# Patient Record
Sex: Male | Born: 1995 | Race: Asian | Hispanic: No | Marital: Single
Health system: Southern US, Community
[De-identification: ages and names within clinical notes are randomized; demographics above are authoritative.]

---
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
## 2015-05-22 ENCOUNTER — Other Ambulatory Visit (HOSPITAL_COMMUNITY): Payer: Self-pay | Admitting: Orthopaedic Surgery

## 2015-05-22 DIAGNOSIS — M25572 Pain in left ankle and joints of left foot: Principal | ICD-10-CM

## 2015-05-22 DIAGNOSIS — M25571 Pain in right ankle and joints of right foot: Secondary | ICD-10-CM

## 2015-06-02 ENCOUNTER — Ambulatory Visit (HOSPITAL_COMMUNITY)
Admission: RE | Admit: 2015-06-02 | Discharge: 2015-06-02 | Disposition: A | Discharge status: Home / Self Care | Payer: BLUE CROSS/BLUE SHIELD | Source: Ambulatory Visit | Attending: Orthopaedic Surgery | Admitting: Orthopaedic Surgery

## 2015-06-02 DIAGNOSIS — M25571 Pain in right ankle and joints of right foot: Secondary | ICD-10-CM | POA: Diagnosis not present

## 2015-06-02 DIAGNOSIS — M25572 Pain in left ankle and joints of left foot: Secondary | ICD-10-CM | POA: Diagnosis present

## 2015-06-02 DIAGNOSIS — Q6689 Other  specified congenital deformities of feet: Secondary | ICD-10-CM | POA: Diagnosis not present

## 2016-12-31 IMAGING — MR MR ANKLE*L* W/O CM
4 of 5 series · 19 of 40 positions shown · non-contrast
Comparison: Radiographs 11/29/2013

CLINICAL DATA: Constant pain in both ankles. Symptoms for over 1
year.

EXAM:
MRI OF THE LEFT ANKLE WITHOUT CONTRAST
TECHNIQUE: Multiplanar, multisequence MR imaging of the ankle was performed. No
intravenous contrast was administered.

[Series 2: PD fat-sat · axial · 4.0mm · 0.29mm/px · z∈[-69,+86]mm · 9 of 32 slices shown]
[im 1/32]
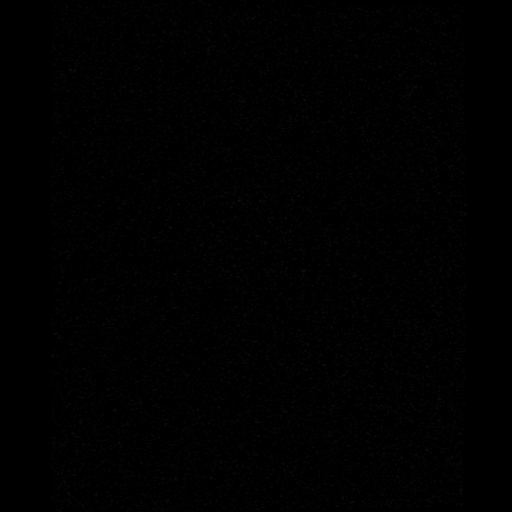
[im 4/32]
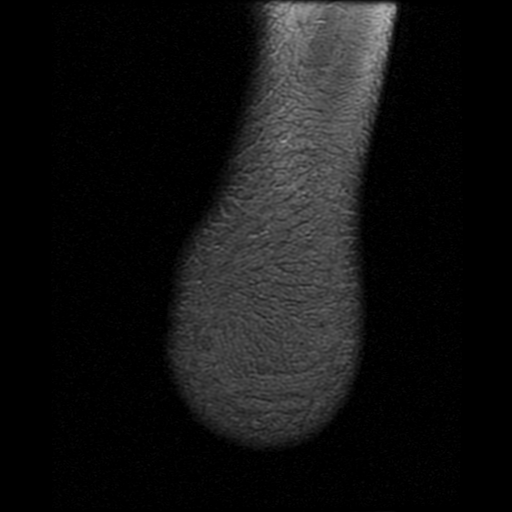
[im 8/32]
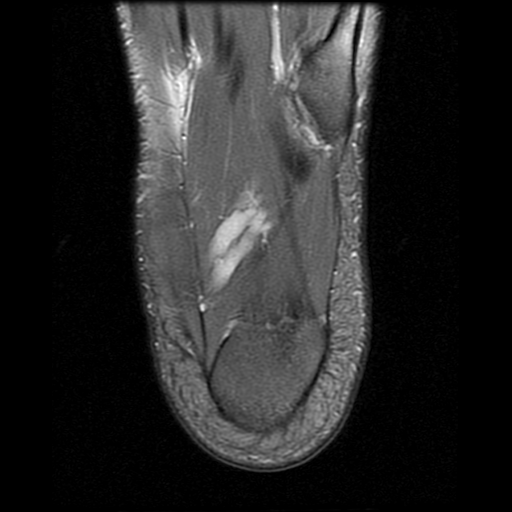
[im 12/32]
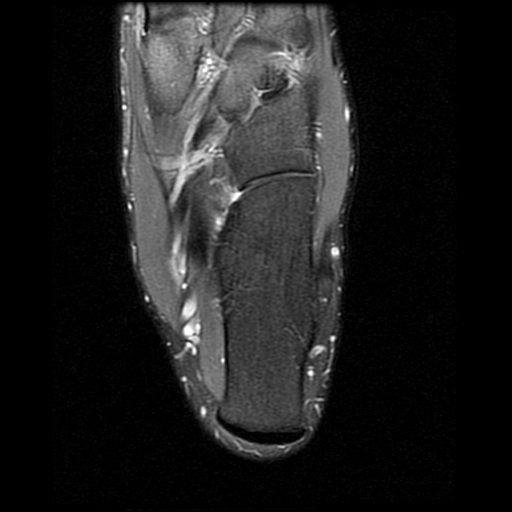
[im 16/32]
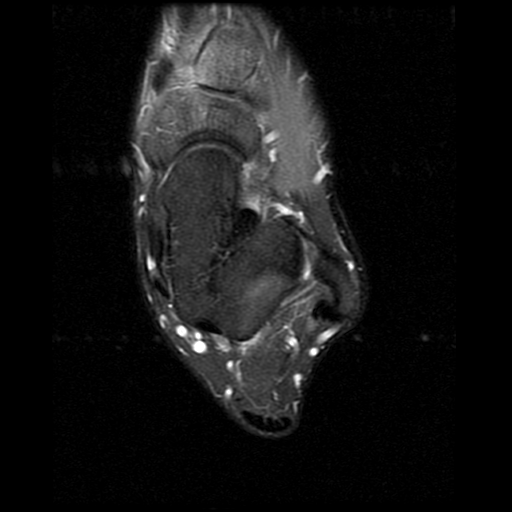
[im 20/32]
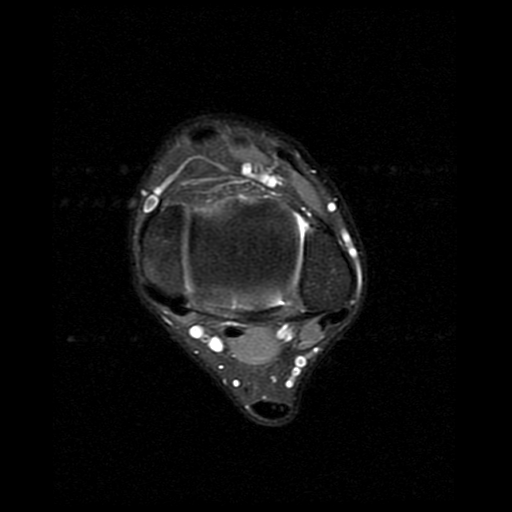
[im 24/32]
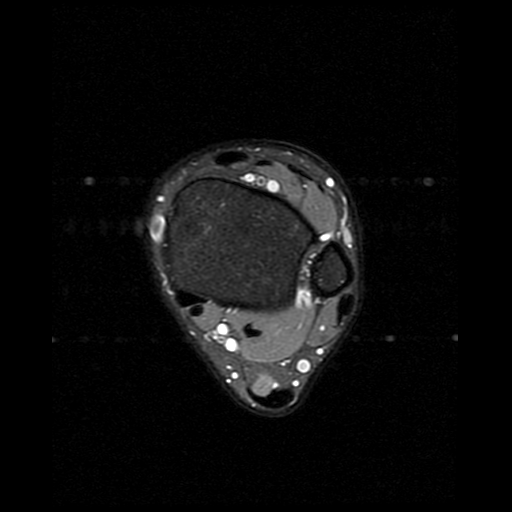
[im 28/32]
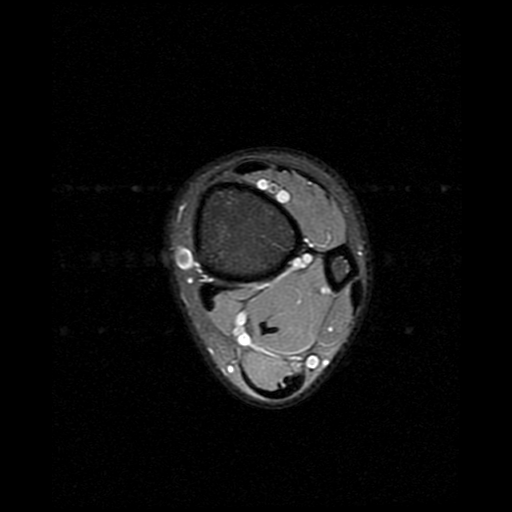
[im 32/32]
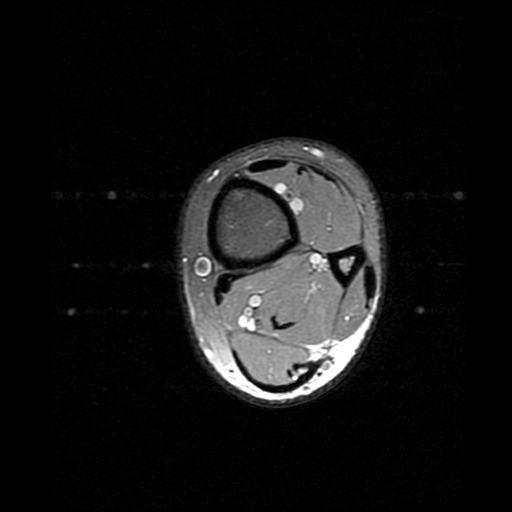

[Series 3: T2 fat-sat · axial · 4.0mm · 0.29mm/px · z∈[-69,+66]mm · 4 of 32 slices shown (1 of 3)]
[im 1/32]
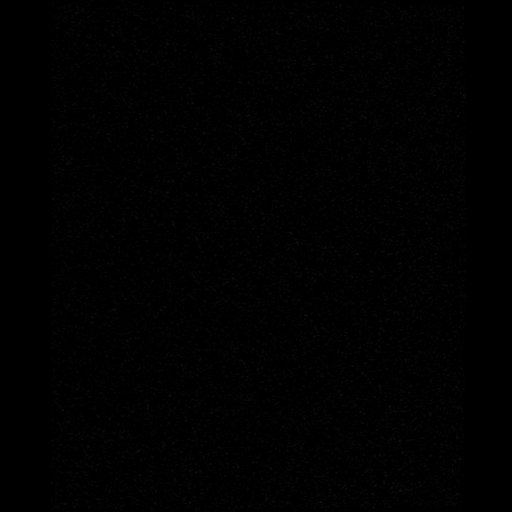
[im 4/32]
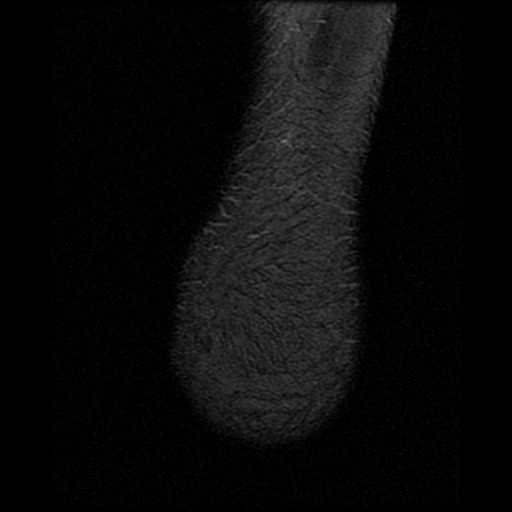
[im 18/32]
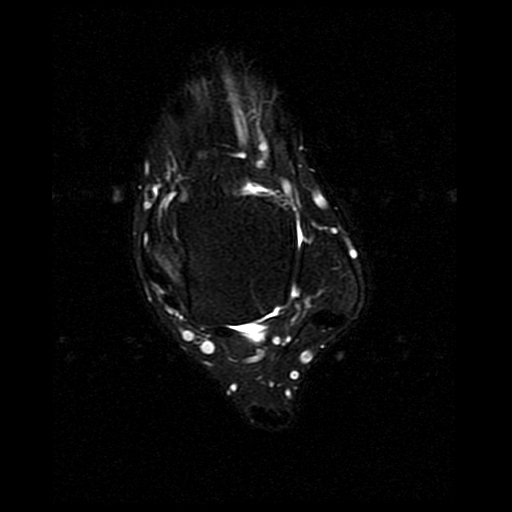
[im 28/32]
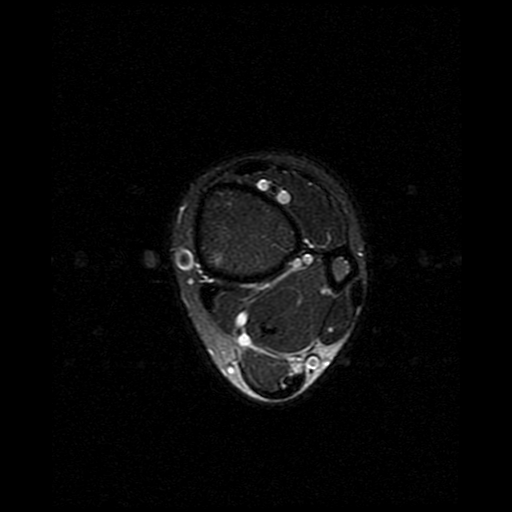

[Series 4: T2 fat-sat · sagittal · 4.0mm · 0.29mm/px · 3 of 19 slices shown (2 of 3)]
[im 4/19]
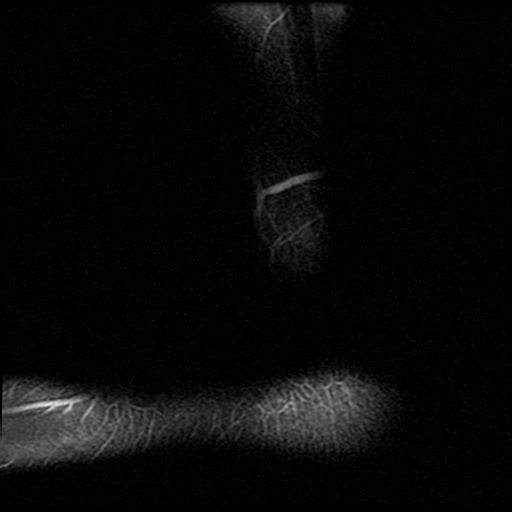
[im 11/19]
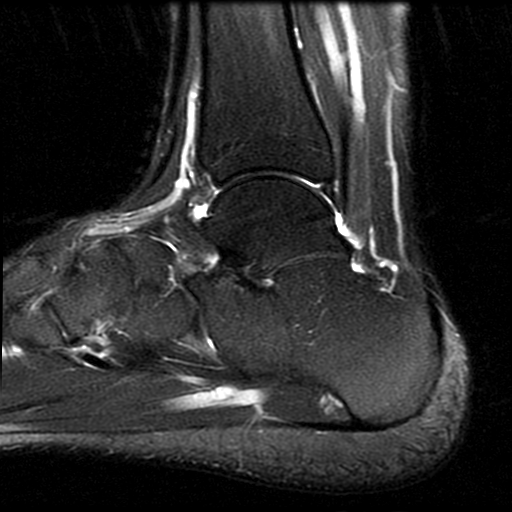
[im 19/19]
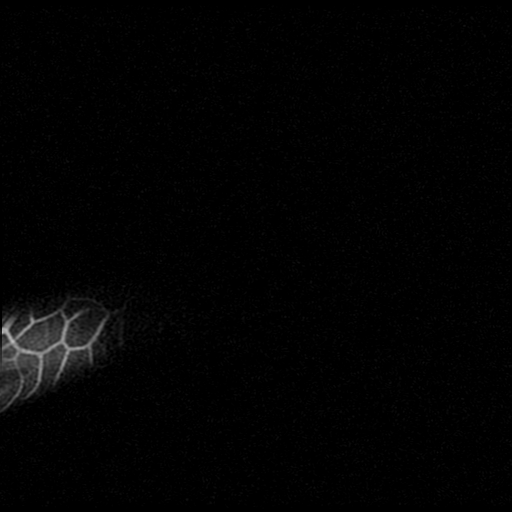

[Series 6: T2 fat-sat · coronal · 4.0mm · 0.29mm/px · 3 of 29 slices shown (3 of 3)]
[im 4/29]
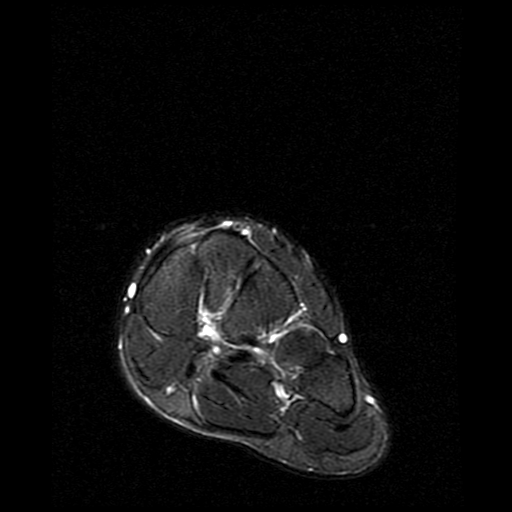
[im 15/29]
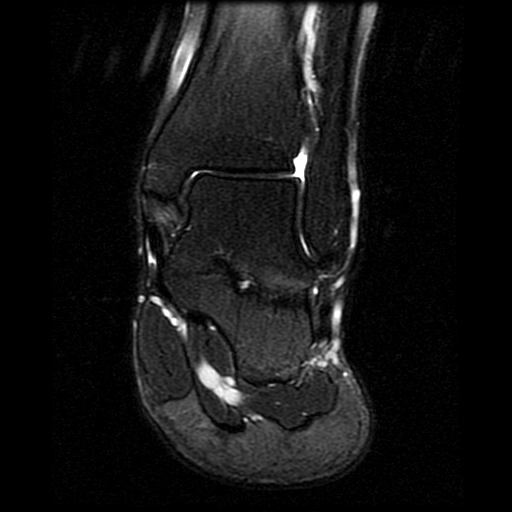
[im 25/29]
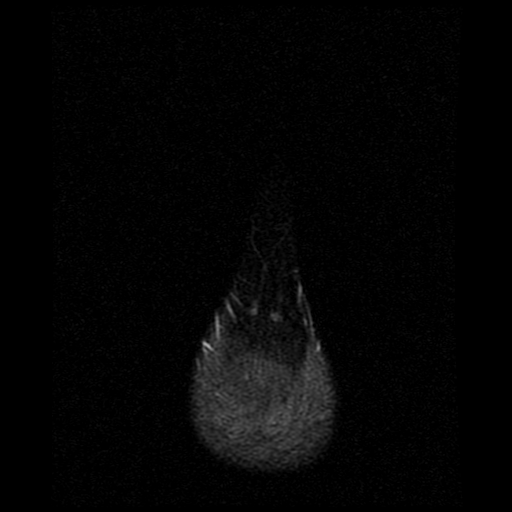

[19 of 40 positions shown; findings below may reference images not displayed]

FINDINGS: TENDONS

Peroneal: Intact.

Posteromedial: Intact.

Anterior: Normal.

Achilles: Normal.

Plantar Fascia: Intact.

LIGAMENTS

Lateral: Intact.

Medial: Intact.

CARTILAGE

Ankle Joint: No degenerative changes, osteochondral abnormality or
joint effusion.

Subtalar Joints/Sinus Tarsi: Tarsal coalition noted at the middle
talocalcaneal facet. Mild/early midfoot degenerative changes. No pes
planus deformity. The sinus tarsi is normal. The spring ligament is
intact.

Bones: No stress fracture or osteochondral lesion. Talocalcaneal
coalition noted.
IMPRESSION: 1. Talocalcaneal tarsal coalition at the middle facet. Mild/early
associated midfoot degenerative changes. No stress fracture.
2. Intact medial and lateral ankle ligaments and tendons.
